# Patient Record
Sex: Female | Born: 1970 | Race: White | Hispanic: No | Marital: Married | Smoking: Never smoker
Health system: Southern US, Community
[De-identification: ages and names within clinical notes are randomized; demographics above are authoritative.]

## PROBLEM LIST (undated history)

## (undated) DIAGNOSIS — F419 Anxiety disorder, unspecified: Secondary | ICD-10-CM

## (undated) DIAGNOSIS — R350 Frequency of micturition: Secondary | ICD-10-CM

## (undated) DIAGNOSIS — N200 Calculus of kidney: Secondary | ICD-10-CM

## (undated) HISTORY — PX: WISDOM TOOTH EXTRACTION: SHX21

## (undated) HISTORY — PX: OTHER SURGICAL HISTORY: SHX169

---

## 2002-09-29 ENCOUNTER — Encounter: Payer: Self-pay | Admitting: Family Medicine

## 2002-09-29 ENCOUNTER — Encounter: Admission: RE | Admit: 2002-09-29 | Discharge: 2002-09-29 | Payer: Self-pay | Admitting: Family Medicine

## 2003-08-13 ENCOUNTER — Other Ambulatory Visit: Admission: RE | Admit: 2003-08-13 | Discharge: 2003-08-13 | Payer: Self-pay | Admitting: Gastroenterology

## 2005-08-18 ENCOUNTER — Other Ambulatory Visit: Admission: RE | Admit: 2005-08-18 | Discharge: 2005-08-18 | Payer: Self-pay | Admitting: Family Medicine

## 2006-02-11 ENCOUNTER — Emergency Department (HOSPITAL_COMMUNITY): Admission: EM | Admit: 2006-02-11 | Discharge: 2006-02-11 | Payer: Self-pay | Admitting: Emergency Medicine

## 2006-10-15 ENCOUNTER — Other Ambulatory Visit: Admission: RE | Admit: 2006-10-15 | Discharge: 2006-10-15 | Payer: Self-pay | Admitting: Family Medicine

## 2010-05-22 ENCOUNTER — Encounter: Payer: Self-pay | Admitting: Family Medicine

## 2011-02-25 ENCOUNTER — Encounter: Payer: Self-pay | Admitting: *Deleted

## 2011-02-25 ENCOUNTER — Emergency Department (HOSPITAL_BASED_OUTPATIENT_CLINIC_OR_DEPARTMENT_OTHER)
Admission: EM | Admit: 2011-02-25 | Discharge: 2011-02-25 | Disposition: A | Discharge status: Home / Self Care | Payer: Managed Care, Other (non HMO) | Attending: Emergency Medicine | Admitting: Emergency Medicine

## 2011-02-25 DIAGNOSIS — N200 Calculus of kidney: Secondary | ICD-10-CM

## 2011-02-25 DIAGNOSIS — R109 Unspecified abdominal pain: Secondary | ICD-10-CM | POA: Insufficient documentation

## 2011-02-25 DIAGNOSIS — R112 Nausea with vomiting, unspecified: Secondary | ICD-10-CM | POA: Insufficient documentation

## 2011-02-25 HISTORY — DX: Calculus of kidney: N20.0

## 2011-02-25 LAB — URINALYSIS, ROUTINE W REFLEX MICROSCOPIC
Bilirubin Urine: NEGATIVE
Glucose, UA: NEGATIVE mg/dL
Ketones, ur: NEGATIVE mg/dL
Nitrite: NEGATIVE
Protein, ur: NEGATIVE mg/dL
Specific Gravity, Urine: 1.022 (ref 1.005–1.030)
Urobilinogen, UA: 0.2 mg/dL (ref 0.0–1.0)
pH: 6 (ref 5.0–8.0)

## 2011-02-25 LAB — URINE MICROSCOPIC-ADD ON

## 2011-02-25 LAB — PREGNANCY, URINE: Preg Test, Ur: NEGATIVE

## 2011-02-25 MED ORDER — HYDROCODONE-ACETAMINOPHEN 5-325 MG PO TABS
1.0000 | ORAL_TABLET | Freq: Once | ORAL | Status: AC
  Administered 2011-02-25: 1 via ORAL
  Filled 2011-02-25: qty 1

## 2011-02-25 MED ORDER — ONDANSETRON HCL 4 MG PO TABS: 4.0000 mg | ORAL_TABLET | Freq: Four times a day (QID) | ORAL | Status: AC

## 2011-02-25 MED ORDER — ONDANSETRON 4 MG PO TBDP
4.0000 mg | ORAL_TABLET | Freq: Once | ORAL | Status: AC
  Administered 2011-02-25: 4 mg via ORAL
  Filled 2011-02-25: qty 1

## 2011-02-25 MED ORDER — HYDROCODONE-ACETAMINOPHEN 5-325 MG PO TABS: ORAL_TABLET | ORAL | Status: DC

## 2011-02-25 NOTE — ED Provider Notes (Signed)
History     CSN: [id number] Arrival date & time: 02/25/2011  5:15 PM   First MD Initiated Contact with Patient 02/25/11 1914      Chief Complaint  Patient presents with  . Flank Pain    (Consider location/radiation/quality/duration/timing/severity/associated sxs/prior treatment) HPI Comments: Pt began having R flank pain ~ 1000 today.  accompanied by diaphoresis and nausea and vomiting and hematuria.  Felt like the last kidney stone she had 4 years ago./  No UTI sxs.  No fever.  No trauma.   Patient is a 40 y.o. female presenting with flank pain. The history is provided by the patient. No language interpreter was used.  Flank Pain This is a new problem. The current episode started today. The problem occurs constantly. The problem has been resolved. Associated symptoms include nausea and vomiting. Pertinent negatives include no chills or fever. The symptoms are aggravated by nothing.    Past Medical History  Diagnosis Date  . Kidney stones     Past Surgical History  Procedure Date  . Other surgical history cyst removed from urethra    No family history on file.  History  Substance Use Topics  . Smoking status: Never Smoker   . Smokeless tobacco: Not on file  . Alcohol Use: No    OB History    Grav Para Term Preterm Abortions TAB SAB Ect Mult Living                  Review of Systems  Constitutional: Negative for fever and chills.  Gastrointestinal: Positive for nausea and vomiting.  Genitourinary: Positive for hematuria and flank pain. Negative for dysuria, urgency, vaginal bleeding, vaginal discharge and vaginal pain.  All other systems reviewed and are negative.    Allergies  Review of patient's allergies indicates no known allergies.  Home Medications   Current Outpatient Rx  Name Route Sig Dispense Refill  . CIPROFLOXACIN HCL 250 MG PO TABS Oral Take 250 mg by mouth 2 (two) times daily.      Marland Kitchen LEVONORGEST-ETH ESTRAD 91-DAY 0.15-0.03 MG PO TABS Oral  Take 1 tablet by mouth daily.      Marland Kitchen LORATADINE 10 MG PO TABS Oral Take 10 mg by mouth daily.      Marland Kitchen ONE-DAILY MULTI VITAMINS PO TABS Oral Take 1 tablet by mouth daily.        BP 120/79  Pulse 96  Temp(Src) 98.2 F (36.8 C) (Oral)  Resp 20  Ht 5\' 5"  (1.651 m)  Wt 180 lb (81.647 kg)  BMI 29.95 kg/m2  SpO2 98%  Physical Exam  Nursing note and vitals reviewed. Constitutional: She is oriented to person, place, and time. She appears well-developed and well-nourished. No distress.  HENT:  Head: Normocephalic and atraumatic.  Eyes: EOM are normal.  Neck: Normal range of motion.  Cardiovascular: Normal rate, regular rhythm and normal heart sounds.   Pulmonary/Chest: Effort normal and breath sounds normal.  Abdominal: Soft. She exhibits no distension. There is no tenderness.    Musculoskeletal: Normal range of motion.       Back:  Neurological: She is alert and oriented to person, place, and time.  Skin: Skin is warm and dry.  Psychiatric: She has a normal mood and affect. Judgment normal.    ED Course  Procedures (including critical care time)  Labs Reviewed  URINALYSIS, ROUTINE W REFLEX MICROSCOPIC - Abnormal; Notable for the following:    Appearance CLOUDY (*)    Hgb urine dipstick LARGE (*)  Leukocytes, UA TRACE (*)    All other components within normal limits  URINE MICROSCOPIC-ADD ON - Abnormal; Notable for the following:    Bacteria, UA MANY (*)    Casts HYALINE CASTS (*)    All other components within normal limits  PREGNANCY, URINE   No results found.   No diagnosis found.    MDM  Spoke with pt about the lack of need for a CT scan of the abd/pelvis at this point since her pain has greatly dissipated.  She had hematuria and similar sxs with a previous kidney stone.  She understands and agrees.        Worthy Rancher, PA 02/25/11 1945

## 2011-02-25 NOTE — ED Notes (Signed)
Pt states has had flank pain since WednesdayMicah Benitez to PCP and was started on antibiotic for possible UTI- office called on Friday and said it wasn't a UTI- here for further eval- states difficulty urinating and loose stools

## 2011-02-25 NOTE — ED Provider Notes (Signed)
Medical screening examination/treatment/procedure(s) were performed by non-physician practitioner and as supervising physician I was immediately available for consultation/collaboration.  Raeford Razor, MD 02/25/11 2351

## 2011-02-25 NOTE — ED Notes (Signed)
Pt states she is feeling much better now after voiding and is comfortable. Offered to start IV, but pt requests that we wait since she is feeling better. (Has had several bad experiences prior with IV's) Also offered to give pt Zofran ODT, which she also declined because "it gave my mom a severe migraine".

## 2011-02-28 ENCOUNTER — Emergency Department (HOSPITAL_COMMUNITY): Payer: Managed Care, Other (non HMO)

## 2011-02-28 ENCOUNTER — Emergency Department (HOSPITAL_COMMUNITY)
Admission: EM | Admit: 2011-02-28 | Discharge: 2011-02-28 | Disposition: A | Discharge status: Home / Self Care | Payer: Managed Care, Other (non HMO) | Attending: Emergency Medicine | Admitting: Emergency Medicine

## 2011-02-28 DIAGNOSIS — R319 Hematuria, unspecified: Secondary | ICD-10-CM | POA: Insufficient documentation

## 2011-02-28 DIAGNOSIS — R109 Unspecified abdominal pain: Secondary | ICD-10-CM | POA: Insufficient documentation

## 2011-02-28 DIAGNOSIS — Z87442 Personal history of urinary calculi: Secondary | ICD-10-CM | POA: Insufficient documentation

## 2011-02-28 DIAGNOSIS — R112 Nausea with vomiting, unspecified: Secondary | ICD-10-CM | POA: Insufficient documentation

## 2011-02-28 LAB — POCT PREGNANCY, URINE: Preg Test, Ur: NEGATIVE

## 2011-02-28 LAB — URINALYSIS, ROUTINE W REFLEX MICROSCOPIC
Bilirubin Urine: NEGATIVE
Leukocytes, UA: NEGATIVE
Nitrite: NEGATIVE
Specific Gravity, Urine: 1.028 (ref 1.005–1.030)
pH: 7 (ref 5.0–8.0)

## 2011-02-28 LAB — URINE MICROSCOPIC-ADD ON

## 2013-05-05 IMAGING — CT CT ABD-PELV W/O CM
1 of 2 series · 16 of 32 positions shown, 20 images · non-contrast
Comparison: 02/11/2006

CLINICAL DATA: Pelvic pain, pressure.

CT ABDOMEN AND PELVIS WITHOUT CONTRAST
TECHNIQUE: Multidetector CT imaging of the abdomen and pelvis was
performed following the standard protocol without intravenous
contrast.

[Series 2: abd/pel w/o · axial · non-contrast · 0.79mm/px · z∈[+894,+1319]mm · 16 of 93 slices shown, 20 images]
[im 4/93  soft-tissue]
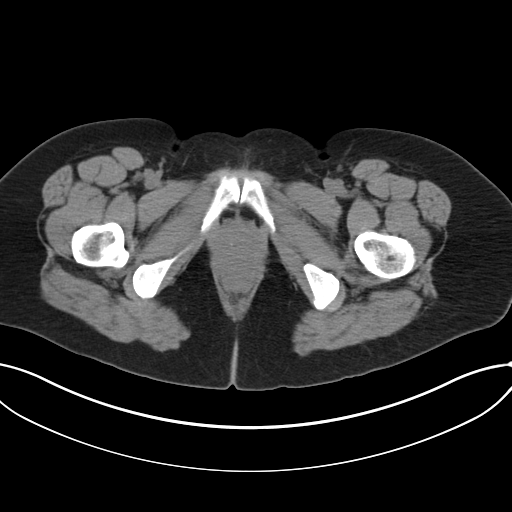
[im 4/93  bone]
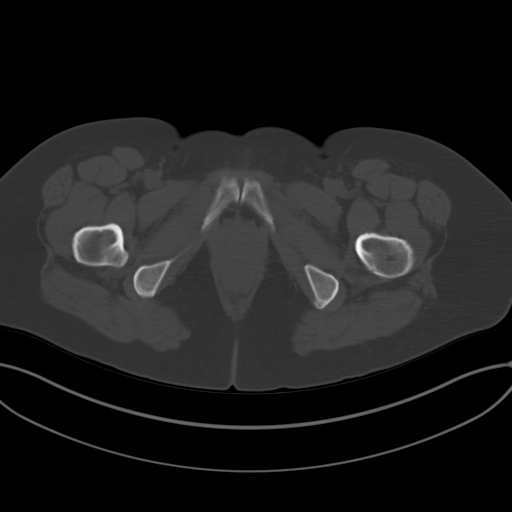
[im 12/93  soft-tissue]
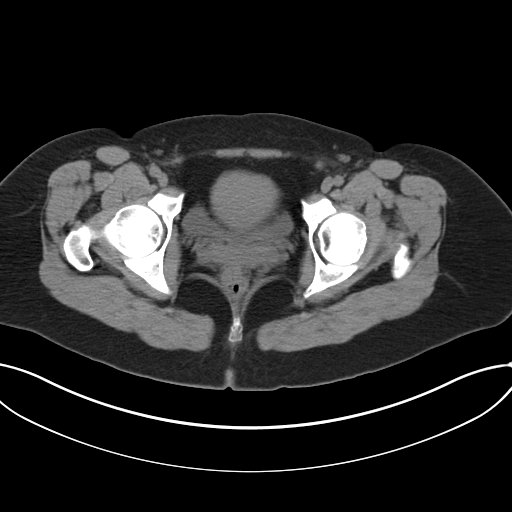
[im 20/93  soft-tissue]
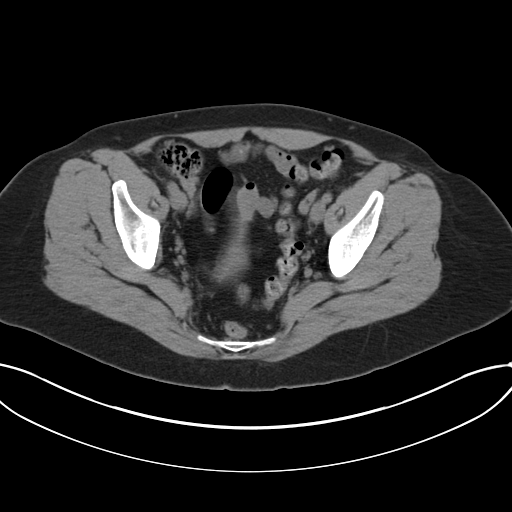
[im 24/93  soft-tissue]
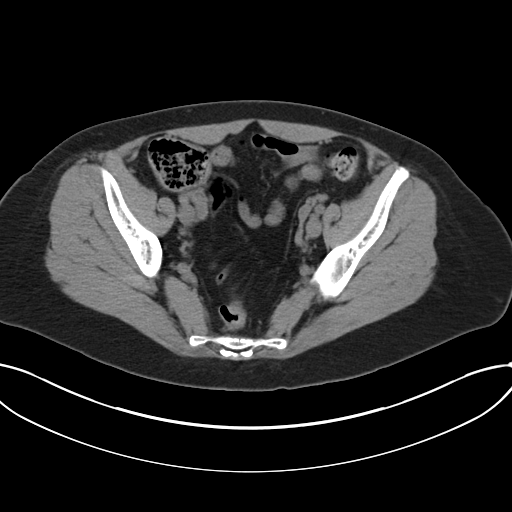
[im 31/93  soft-tissue]
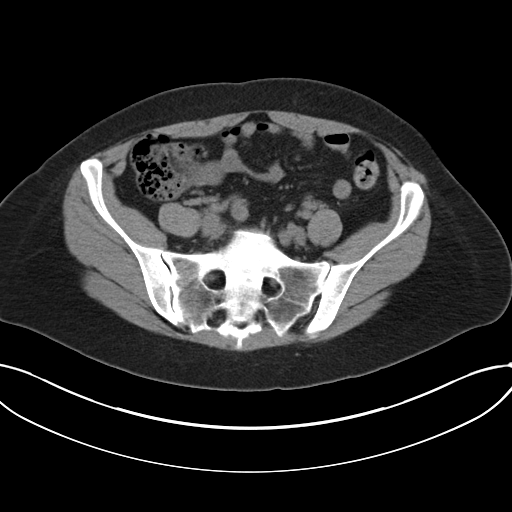
[im 39/93  soft-tissue]
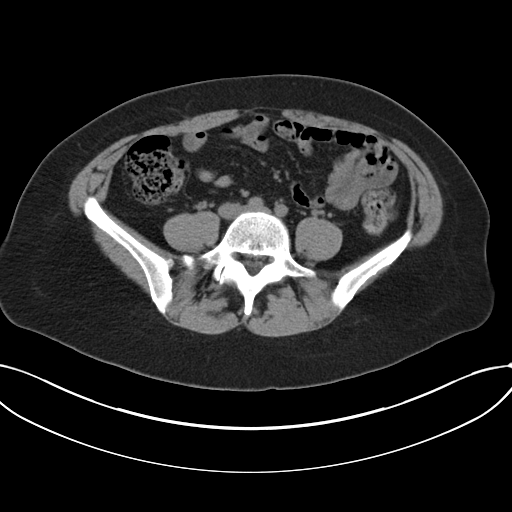
[im 43/93  soft-tissue]
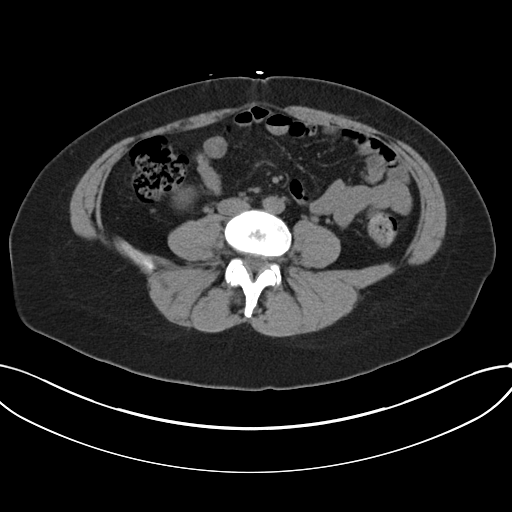
[im 50/93  soft-tissue]
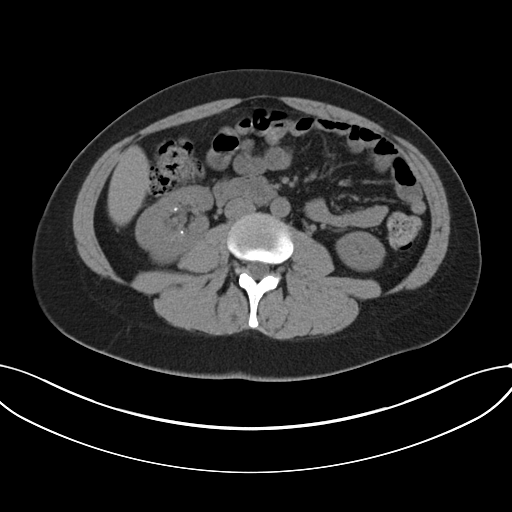
[im 54/93  soft-tissue]
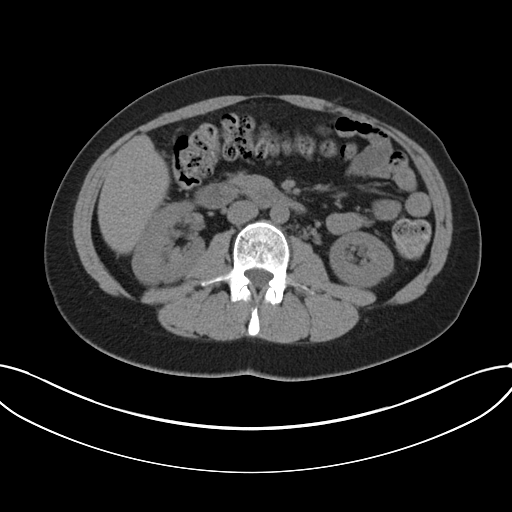
[im 54/93  bone]
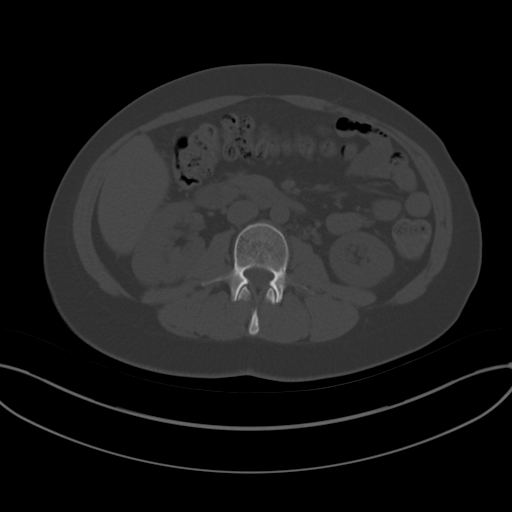
[im 62/93  soft-tissue]
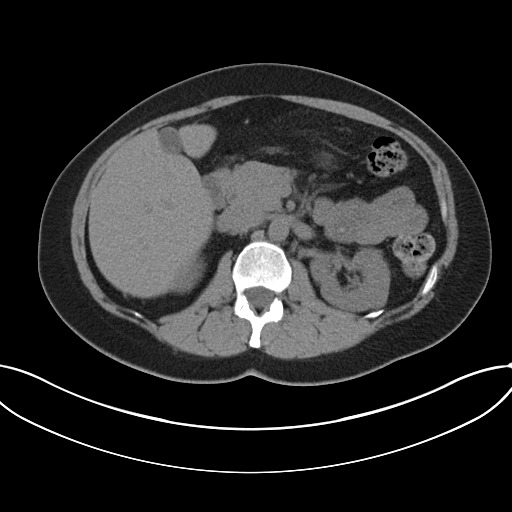
[im 70/93  soft-tissue]
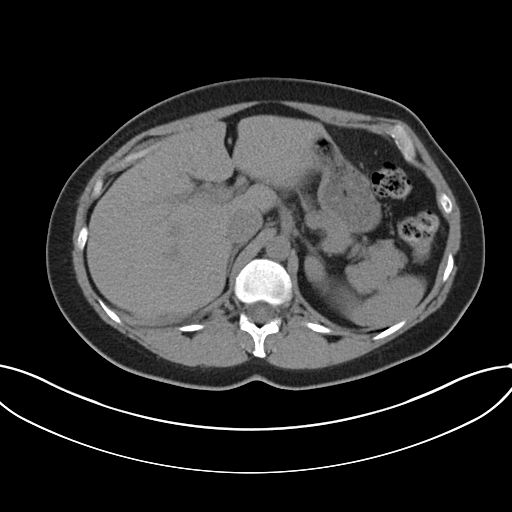
[im 73/93  soft-tissue]
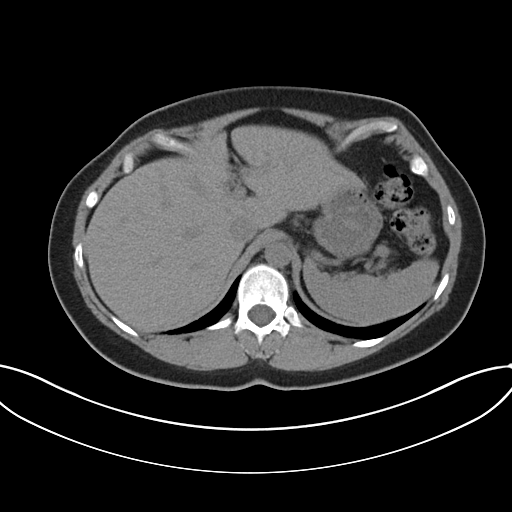
[im 77/93  lung]
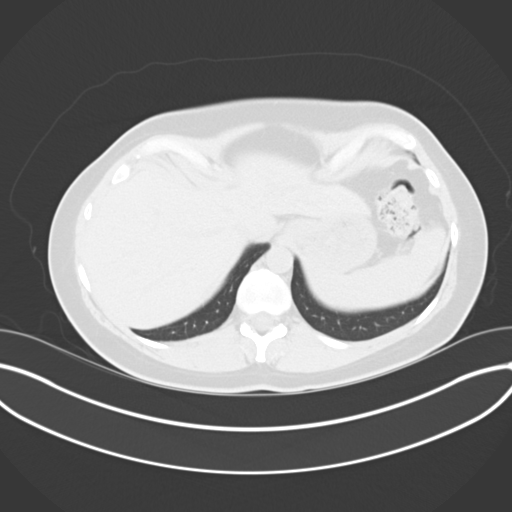
[im 81/93  soft-tissue]
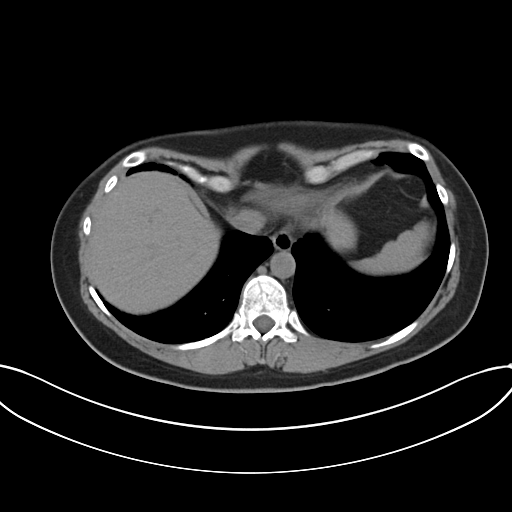
[im 81/93  lung]
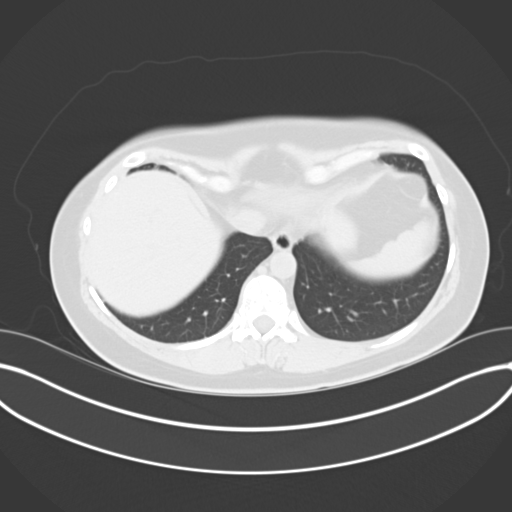
[im 85/93  lung]
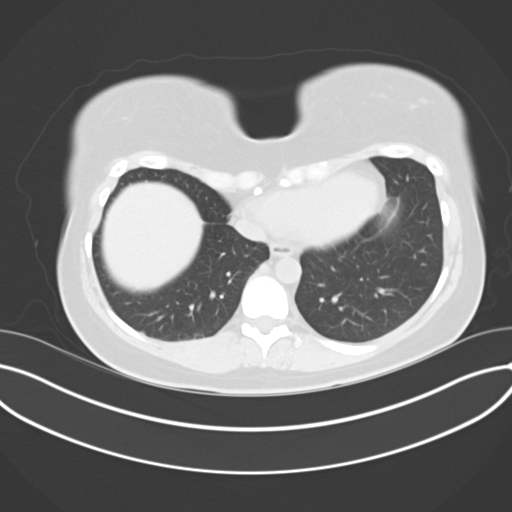
[im 89/93  soft-tissue]
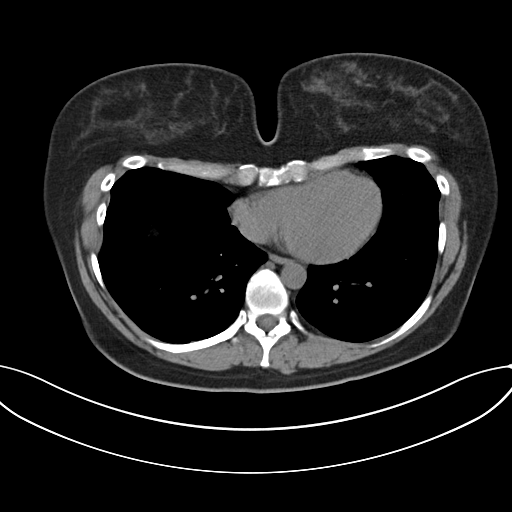
[im 89/93  lung]
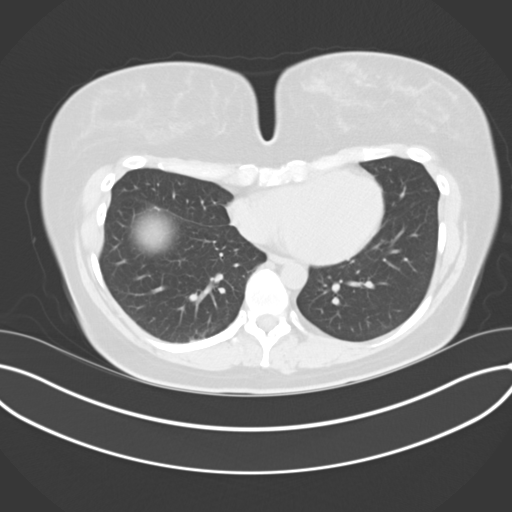

[16 of 32 positions shown; findings below may reference images not displayed]

FINDINGS: Lung bases are clear.  No effusions.  Heart is normal
size.

Small punctate nonobstructing stones in the kidneys bilaterally.
No hydronephrosis.  Calcifications are seen in the anatomic pelvis
bilaterally.  They are difficult to separate from the distal
ureters, but given the lack of hydronephrosis, I suspect these are
phleboliths.  Urinary bladder decompressed.

Uterus and adnexa have an unremarkable unenhanced appearance.
Large and small bowel are unremarkable.  No free fluid, free air or
adenopathy.
IMPRESSION: Bilateral nephrolithiasis.  No ureteral stones or hydronephrosis.

## 2013-08-08 ENCOUNTER — Encounter (HOSPITAL_BASED_OUTPATIENT_CLINIC_OR_DEPARTMENT_OTHER): Payer: Self-pay | Admitting: *Deleted

## 2013-08-08 NOTE — Progress Notes (Signed)
No labs needed

## 2013-08-12 ENCOUNTER — Ambulatory Visit (HOSPITAL_BASED_OUTPATIENT_CLINIC_OR_DEPARTMENT_OTHER): Payer: BC Managed Care – PPO | Admitting: Anesthesiology

## 2013-08-12 ENCOUNTER — Encounter (HOSPITAL_BASED_OUTPATIENT_CLINIC_OR_DEPARTMENT_OTHER)
Admission: RE | Disposition: A | Discharge status: Home / Self Care | Payer: Self-pay | Source: Ambulatory Visit | Attending: Plastic Surgery

## 2013-08-12 ENCOUNTER — Encounter (HOSPITAL_BASED_OUTPATIENT_CLINIC_OR_DEPARTMENT_OTHER): Payer: BC Managed Care – PPO | Admitting: Anesthesiology

## 2013-08-12 ENCOUNTER — Encounter (HOSPITAL_BASED_OUTPATIENT_CLINIC_OR_DEPARTMENT_OTHER): Payer: Self-pay | Admitting: Anesthesiology

## 2013-08-12 ENCOUNTER — Ambulatory Visit (HOSPITAL_BASED_OUTPATIENT_CLINIC_OR_DEPARTMENT_OTHER)
Admission: RE | Admit: 2013-08-12 | Discharge: 2013-08-12 | Disposition: A | Discharge status: Home / Self Care | Payer: BC Managed Care – PPO | Source: Ambulatory Visit | Attending: Plastic Surgery | Admitting: Plastic Surgery

## 2013-08-12 DIAGNOSIS — N6019 Diffuse cystic mastopathy of unspecified breast: Secondary | ICD-10-CM | POA: Insufficient documentation

## 2013-08-12 DIAGNOSIS — N62 Hypertrophy of breast: Secondary | ICD-10-CM | POA: Insufficient documentation

## 2013-08-12 DIAGNOSIS — M546 Pain in thoracic spine: Secondary | ICD-10-CM | POA: Insufficient documentation

## 2013-08-12 DIAGNOSIS — M542 Cervicalgia: Secondary | ICD-10-CM | POA: Insufficient documentation

## 2013-08-12 DIAGNOSIS — R21 Rash and other nonspecific skin eruption: Secondary | ICD-10-CM | POA: Insufficient documentation

## 2013-08-12 HISTORY — DX: Frequency of micturition: R35.0

## 2013-08-12 HISTORY — DX: Anxiety disorder, unspecified: F41.9

## 2013-08-12 HISTORY — PX: BREAST REDUCTION SURGERY: SHX8

## 2013-08-12 LAB — POCT HEMOGLOBIN-HEMACUE: HEMOGLOBIN: 14 g/dL (ref 12.0–15.0)

## 2013-08-12 SURGERY — BREAST REDUCTION WITH LIPOSUCTION
Anesthesia: General | Site: Breast | Laterality: Bilateral

## 2013-08-12 MED ORDER — CEFAZOLIN SODIUM-DEXTROSE 2-3 GM-% IV SOLR
INTRAVENOUS | Status: AC
  Filled 2013-08-12: qty 50

## 2013-08-12 MED ORDER — MIDAZOLAM HCL 5 MG/5ML IJ SOLN
INTRAMUSCULAR | Status: DC | PRN
  Administered 2013-08-12: 2 mg via INTRAVENOUS

## 2013-08-12 MED ORDER — LIDOCAINE-EPINEPHRINE 1 %-1:100000 IJ SOLN
INTRAMUSCULAR | Status: AC
  Filled 2013-08-12: qty 1

## 2013-08-12 MED ORDER — BUPIVACAINE LIPOSOME 1.3 % IJ SUSP
INTRAMUSCULAR | Status: AC
  Filled 2013-08-12: qty 20

## 2013-08-12 MED ORDER — OXYCODONE HCL 5 MG PO TABS: 5.0000 mg | ORAL_TABLET | Freq: Once | ORAL | Status: DC | PRN

## 2013-08-12 MED ORDER — MIDAZOLAM HCL 2 MG/2ML IJ SOLN
INTRAMUSCULAR | Status: AC
  Filled 2013-08-12: qty 2

## 2013-08-12 MED ORDER — FENTANYL CITRATE 0.05 MG/ML IJ SOLN
INTRAMUSCULAR | Status: AC
  Filled 2013-08-12: qty 10

## 2013-08-12 MED ORDER — LIDOCAINE-EPINEPHRINE 1 %-1:100000 IJ SOLN
INTRAMUSCULAR | Status: DC | PRN
  Administered 2013-08-12: 40 mL

## 2013-08-12 MED ORDER — SUCCINYLCHOLINE CHLORIDE 20 MG/ML IJ SOLN
INTRAMUSCULAR | Status: DC | PRN
  Administered 2013-08-12: 100 mg via INTRAVENOUS

## 2013-08-12 MED ORDER — CEFAZOLIN SODIUM 1-5 GM-% IV SOLN
1.0000 g | Freq: Once | INTRAVENOUS | Status: AC
  Administered 2013-08-12: 2 g via INTRAVENOUS

## 2013-08-12 MED ORDER — OXYCODONE HCL 5 MG/5ML PO SOLN: 5.0000 mg | Freq: Once | ORAL | Status: DC | PRN

## 2013-08-12 MED ORDER — PROPOFOL 10 MG/ML IV BOLUS
INTRAVENOUS | Status: DC | PRN
  Administered 2013-08-12: 150 mg via INTRAVENOUS
  Administered 2013-08-12: 50 mg via INTRAVENOUS

## 2013-08-12 MED ORDER — BACITRACIN ZINC 500 UNIT/GM EX OINT
TOPICAL_OINTMENT | CUTANEOUS | Status: DC | PRN
  Administered 2013-08-12: 1 via TOPICAL

## 2013-08-12 MED ORDER — PROMETHAZINE HCL 25 MG/ML IJ SOLN
INTRAMUSCULAR | Status: AC
  Filled 2013-08-12: qty 1

## 2013-08-12 MED ORDER — FENTANYL CITRATE 0.05 MG/ML IJ SOLN: 50.0000 ug | INTRAMUSCULAR | Status: DC | PRN

## 2013-08-12 MED ORDER — ONDANSETRON 8 MG PO TBDP
ORAL_TABLET | ORAL | Status: AC
  Filled 2013-08-12: qty 1

## 2013-08-12 MED ORDER — 0.9 % SODIUM CHLORIDE (POUR BTL) OPTIME
TOPICAL | Status: DC | PRN
  Administered 2013-08-12: 2000 mL

## 2013-08-12 MED ORDER — LIDOCAINE HCL (PF) 1 % IJ SOLN
INTRAMUSCULAR | Status: AC
  Filled 2013-08-12: qty 30

## 2013-08-12 MED ORDER — EPINEPHRINE HCL 1 MG/ML IJ SOLN
INTRAMUSCULAR | Status: AC
  Filled 2013-08-12: qty 1

## 2013-08-12 MED ORDER — ONDANSETRON 8 MG PO TBDP
8.0000 mg | ORAL_TABLET | Freq: Once | ORAL | Status: AC | PRN
  Administered 2013-08-12: 8 mg via ORAL

## 2013-08-12 MED ORDER — FENTANYL CITRATE 0.05 MG/ML IJ SOLN
INTRAMUSCULAR | Status: DC | PRN
  Administered 2013-08-12: 50 ug via INTRAVENOUS
  Administered 2013-08-12 (×2): 100 ug via INTRAVENOUS
  Administered 2013-08-12: 50 ug via INTRAVENOUS
  Administered 2013-08-12: 100 ug via INTRAVENOUS
  Administered 2013-08-12 (×3): 50 ug via INTRAVENOUS
  Administered 2013-08-12: 100 ug via INTRAVENOUS
  Administered 2013-08-12 (×6): 50 ug via INTRAVENOUS

## 2013-08-12 MED ORDER — LIDOCAINE HCL (CARDIAC) 10 MG/ML IV SOLN
INTRAVENOUS | Status: DC | PRN
  Administered 2013-08-12: 40 mg via INTRAVENOUS

## 2013-08-12 MED ORDER — BUPIVACAINE LIPOSOME 1.3 % IJ SUSP
INTRAMUSCULAR | Status: DC | PRN
  Administered 2013-08-12: 20 mL

## 2013-08-12 MED ORDER — LACTATED RINGERS IV SOLN
INTRAVENOUS | Status: DC
  Administered 2013-08-12 (×4): via INTRAVENOUS

## 2013-08-12 MED ORDER — DEXAMETHASONE SODIUM PHOSPHATE 4 MG/ML IJ SOLN
INTRAMUSCULAR | Status: DC | PRN
  Administered 2013-08-12: 10 mg via INTRAVENOUS

## 2013-08-12 MED ORDER — ONDANSETRON HCL 4 MG/2ML IJ SOLN
INTRAMUSCULAR | Status: DC | PRN
  Administered 2013-08-12: 4 mg via INTRAVENOUS

## 2013-08-12 MED ORDER — BACITRACIN ZINC 500 UNIT/GM EX OINT
TOPICAL_OINTMENT | CUTANEOUS | Status: AC
  Filled 2013-08-12: qty 28.35

## 2013-08-12 MED ORDER — BUPIVACAINE-EPINEPHRINE PF 0.5-1:200000 % IJ SOLN
INTRAMUSCULAR | Status: AC
  Filled 2013-08-12: qty 30

## 2013-08-12 MED ORDER — HYDROMORPHONE HCL PF 1 MG/ML IJ SOLN: 0.2500 mg | INTRAMUSCULAR | Status: DC | PRN

## 2013-08-12 MED ORDER — MIDAZOLAM HCL 2 MG/2ML IJ SOLN: 1.0000 mg | INTRAMUSCULAR | Status: DC | PRN

## 2013-08-12 MED ORDER — FENTANYL CITRATE 0.05 MG/ML IJ SOLN
INTRAMUSCULAR | Status: AC
  Filled 2013-08-12: qty 20

## 2013-08-12 MED ORDER — PROPOFOL 10 MG/ML IV BOLUS
INTRAVENOUS | Status: AC
  Filled 2013-08-12: qty 20

## 2013-08-12 MED ORDER — PROMETHAZINE HCL 25 MG/ML IJ SOLN
6.2500 mg | INTRAMUSCULAR | Status: DC | PRN
  Administered 2013-08-12: 6.25 mg via INTRAVENOUS

## 2013-08-12 SURGICAL SUPPLY — 73 items
APL SKNCLS STERI-STRIP NONHPOA (GAUZE/BANDAGES/DRESSINGS) ×2
BAG DECANTER FOR FLEXI CONT (MISCELLANEOUS) ×2 IMPLANT
BENZOIN TINCTURE PRP APPL 2/3 (GAUZE/BANDAGES/DRESSINGS) ×4 IMPLANT
BLADE KNIFE PERSONA 10 (BLADE) ×8 IMPLANT
BLADE KNIFE PERSONA 15 (BLADE) ×6 IMPLANT
BNDG GAUZE ELAST 4 BULKY (GAUZE/BANDAGES/DRESSINGS) ×4 IMPLANT
CANISTER SUCT 1200ML W/VALVE (MISCELLANEOUS) ×3 IMPLANT
CAP BOUFFANT 24 BLUE NURSES (PROTECTIVE WEAR) ×2 IMPLANT
COVER MAYO STAND STRL (DRAPES) ×2 IMPLANT
COVER TABLE BACK 60X90 (DRAPES) ×2 IMPLANT
DECANTER SPIKE VIAL GLASS SM (MISCELLANEOUS) ×5 IMPLANT
DRAIN CHANNEL 10F 3/8 F FF (DRAIN) ×4 IMPLANT
DRAPE LAPAROSCOPIC ABDOMINAL (DRAPES) ×1 IMPLANT
DRAPE U-SHAPE 76X120 STRL (DRAPES) ×2 IMPLANT
DRSG EMULSION OIL 3X3 NADH (GAUZE/BANDAGES/DRESSINGS) ×4 IMPLANT
DRSG PAD ABDOMINAL 8X10 ST (GAUZE/BANDAGES/DRESSINGS) ×4 IMPLANT
ELECT REM PT RETURN 9FT ADLT (ELECTROSURGICAL) ×2
ELECTRODE REM PT RTRN 9FT ADLT (ELECTROSURGICAL) ×1 IMPLANT
EVACUATOR SILICONE 100CC (DRAIN) ×4 IMPLANT
FILTER 7/8 IN (FILTER) ×2 IMPLANT
FILTER LIPOSUCTION (MISCELLANEOUS) ×1 IMPLANT
GLOVE BIO SURGEON STRL SZ 6.5 (GLOVE) ×2 IMPLANT
GLOVE BIO SURGEON STRL SZ7 (GLOVE) ×2 IMPLANT
GLOVE BIOGEL M 7.0 STRL (GLOVE) ×4 IMPLANT
GLOVE BIOGEL PI IND STRL 6.5 (GLOVE) IMPLANT
GLOVE BIOGEL PI IND STRL 7.0 (GLOVE) IMPLANT
GLOVE BIOGEL PI INDICATOR 6.5 (GLOVE)
GLOVE BIOGEL PI INDICATOR 7.0 (GLOVE) ×1
GLOVE ECLIPSE 6.5 STRL STRAW (GLOVE) ×2 IMPLANT
GOWN STRL REUS W/ TWL LRG LVL3 (GOWN DISPOSABLE) ×1 IMPLANT
GOWN STRL REUS W/ TWL XL LVL3 (GOWN DISPOSABLE) IMPLANT
GOWN STRL REUS W/TWL LRG LVL3 (GOWN DISPOSABLE) ×6
GOWN STRL REUS W/TWL XL LVL3 (GOWN DISPOSABLE)
IV SODIUM CHL 0.9% 500ML (IV SOLUTION) ×1 IMPLANT
LINER CANISTER 1000CC FLEX (MISCELLANEOUS) ×4 IMPLANT
MARKER SKIN DUAL TIP RULER LAB (MISCELLANEOUS) ×2 IMPLANT
NDL HYPO 25X1 1.5 SAFETY (NEEDLE) ×1 IMPLANT
NDL SAFETY ECLIPSE 18X1.5 (NEEDLE) IMPLANT
NDL SPNL 18GX3.5 QUINCKE PK (NEEDLE) ×1 IMPLANT
NEEDLE HYPO 18GX1.5 SHARP (NEEDLE)
NEEDLE HYPO 25X1 1.5 SAFETY (NEEDLE) ×4 IMPLANT
NEEDLE SPNL 18GX3.5 QUINCKE PK (NEEDLE) ×2 IMPLANT
NS IRRIG 1000ML POUR BTL (IV SOLUTION) ×4 IMPLANT
PACK BASIN DAY SURGERY FS (CUSTOM PROCEDURE TRAY) ×2 IMPLANT
PIN SAFETY STERILE (MISCELLANEOUS) ×2 IMPLANT
SCRUB PCMX 4 OZ (MISCELLANEOUS) ×3 IMPLANT
SHEET MEDIUM DRAPE 40X70 STRL (DRAPES) ×4 IMPLANT
SLEEVE SCD COMPRESS KNEE MED (MISCELLANEOUS) ×2 IMPLANT
SPECIMEN JAR MEDIUM (MISCELLANEOUS) ×3 IMPLANT
SPECIMEN JAR X LARGE (MISCELLANEOUS) ×3 IMPLANT
SPONGE GAUZE 4X4 12PLY (GAUZE/BANDAGES/DRESSINGS) ×4 IMPLANT
SPONGE LAP 18X18 X RAY DECT (DISPOSABLE) ×8 IMPLANT
STAPLER VISISTAT 35W (STAPLE) ×4 IMPLANT
STRIP CLOSURE SKIN 1/2X4 (GAUZE/BANDAGES/DRESSINGS) ×8 IMPLANT
SUT ETHILON 3 0 PS 1 (SUTURE) ×2 IMPLANT
SUT MNCRL AB 3-0 PS2 18 (SUTURE) ×8 IMPLANT
SUT MNCRL AB 4-0 PS2 18 (SUTURE) ×4 IMPLANT
SUT MON AB 5-0 PS2 18 (SUTURE) ×4 IMPLANT
SUT PROLENE 2 0 CT2 30 (SUTURE) ×2 IMPLANT
SUT PROLENE 3 0 PS 1 (SUTURE) ×4 IMPLANT
SUT QUILL PDO 2-0 (SUTURE) ×4 IMPLANT
SYR 50ML LL SCALE MARK (SYRINGE) IMPLANT
SYR BULB IRRIGATION 50ML (SYRINGE) ×4 IMPLANT
SYR CONTROL 10ML LL (SYRINGE) ×6 IMPLANT
SYR TB 1ML LL NO SAFETY (SYRINGE) IMPLANT
TOWEL OR NON WOVEN STRL DISP B (DISPOSABLE) ×2 IMPLANT
TRAY DSU PREP LF (CUSTOM PROCEDURE TRAY) ×3 IMPLANT
TRAY FOLEY CATH 14FR (SET/KITS/TRAYS/PACK) ×2 IMPLANT
TUBE CONNECTING 20X1/4 (TUBING) ×2 IMPLANT
TUBING SET GRADUATE ASPIR 12FT (MISCELLANEOUS) ×2 IMPLANT
UNDERPAD 30X30 INCONTINENT (UNDERPADS AND DIAPERS) ×4 IMPLANT
VAC PENCILS W/TUBING CLEAR (MISCELLANEOUS) ×2 IMPLANT
YANKAUER SUCT BULB TIP NO VENT (SUCTIONS) ×2 IMPLANT

## 2013-08-12 NOTE — Transfer of Care (Signed)
Immediate Anesthesia Transfer of Care Note  Patient: Anne SackHeather Benitez  Procedure(s) Performed: Procedure(s): BREAST REDUCTION BILATERAL (Bilateral)  Patient Location: PACU  Anesthesia Type:General  Level of Consciousness: sedated and responds to stimulation  Airway & Oxygen Therapy: Patient Spontanous Breathing and Patient connected to face mask oxygen  Post-op Assessment: Report given to PACU RN and Post -op Vital signs reviewed and stable  Post vital signs: Reviewed and stable  Complications: No apparent anesthesia complications

## 2013-08-12 NOTE — Anesthesia Postprocedure Evaluation (Signed)
  Anesthesia Post-op Note  Patient: Anne SackHeather Benitez  Procedure(s) Performed: Procedure(s): BREAST REDUCTION BILATERAL (Bilateral)  Patient Location: PACU  Anesthesia Type:General  Level of Consciousness: awake and alert   Airway and Oxygen Therapy: Patient Spontanous Breathing  Post-op Pain: none  Post-op Assessment: Post-op Vital signs reviewed, Patient's Cardiovascular Status Stable and Respiratory Function Stable  Post-op Vital Signs: Reviewed  Filed Vitals:   08/12/13 1315  BP: 118/61  Pulse: 101  Temp:   Resp: 1    Complications: No apparent anesthesia complications

## 2013-08-12 NOTE — Brief Op Note (Signed)
08/12/2013  11:53 AM  PATIENT:  Anne Benitez  [age] y.o. female  PRE-OPERATIVE DIAGNOSIS:  Hypertrophy of breast; bilateral  POST-OPERATIVE DIAGNOSIS:  Bilateral hypertrophy of breast  PROCEDURE:  Procedure(s): BREAST REDUCTION BILATERAL (Bilateral)  SURGEON:  Surgeon(s) and Role:    * Mary A Contogiannis, MD - Primary  ANESTHESIA:   general  EBL:  Total I/O In: 3000 [I.V.:3000] Out: 1000 [Urine:750; Blood:250]  BLOOD ADMINISTERED:none  DRAINS: (25F) Jackson-Pratt drain(s) with closed bulb suction in the Bilateral Breasts   LOCAL MEDICATIONS USED:  OTHER 1.3% Exparel (266 mgs.)  SPECIMEN:  Source of Specimen:  Bilateral Breasts  DISPOSITION OF SPECIMEN:  PATHOLOGY  COUNTS:  YES  DICTATION: .Other Dictation: Dictation Number [id number]  PLAN OF CARE: Discharge to home after PACU  PATIENT DISPOSITION:  PACU - hemodynamically stable.   Delay start of Pharmacological VTE agent (>24hrs) due to surgical blood loss or risk of bleeding: not applicable

## 2013-08-12 NOTE — H&P (Signed)
  H&P faxed to surgical center.  -History and Physical Reviewed  -Patient has been re-examined  -No change in the plan of care  CONTOGIANNIS,MARY A    

## 2013-08-12 NOTE — Anesthesia Procedure Notes (Signed)
Procedure Name: Intubation Date/Time: 08/12/2013 7:47 AM Performed by: Gar GibbonKEETON, Zaryiah Barz S Pre-anesthesia Checklist: Patient identified, Emergency Drugs available, Suction available and Patient being monitored Patient Re-evaluated:Patient Re-evaluated prior to inductionOxygen Delivery Method: Circle System Utilized Preoxygenation: Pre-oxygenation with 100% oxygen Intubation Type: IV induction Ventilation: Mask ventilation without difficulty Laryngoscope Size: Mac and 3 Grade View: Grade II Tube type: Oral Tube size: 7.0 mm Number of attempts: 1 Airway Equipment and Method: stylet and oral airway Placement Confirmation: ETT inserted through vocal cords under direct vision,  positive ETCO2 and breath sounds checked- equal and bilateral Secured at: 22 cm Tube secured with: Tape Dental Injury: Teeth and Oropharynx as per pre-operative assessment

## 2013-08-12 NOTE — Anesthesia Preprocedure Evaluation (Signed)
Anesthesia Evaluation  Patient identified by MRN, date of birth, ID band Patient awake    Reviewed: Allergy & Precautions, H&P , NPO status , Patient's Chart, lab work & pertinent test results  Airway Mallampati: II  TM Distance: >3 FB Neck ROM: Full    Dental no notable dental hx. (+) Teeth Intact, Dental Advisory Given   Pulmonary neg pulmonary ROS,  breath sounds clear to auscultation  Pulmonary exam normal       Cardiovascular negative cardio ROS  Rhythm:Regular Rate:Normal     Neuro/Psych negative neurological ROS  negative psych ROS   GI/Hepatic negative GI ROS, Neg liver ROS,   Endo/Other  negative endocrine ROS  Renal/GU negative Renal ROS  negative genitourinary   Musculoskeletal   Abdominal   Peds  Hematology negative hematology ROS (+)   Anesthesia Other Findings   Reproductive/Obstetrics negative OB ROS                            Anesthesia Physical Anesthesia Plan  ASA: II  Anesthesia Plan: General   Post-op Pain Management:    Induction: Intravenous  Airway Management Planned: Oral ETT  Additional Equipment:   Intra-op Plan:   Post-operative Plan: Extubation in OR  Informed Consent: I have reviewed the patients History and Physical, chart, labs and discussed the procedure including the risks, benefits and alternatives for the proposed anesthesia with the patient or authorized representative who has indicated his/her understanding and acceptance.   Dental advisory given  Plan Discussed with: CRNA  Anesthesia Plan Comments:         Anesthesia Quick Evaluation  

## 2013-08-12 NOTE — Discharge Instructions (Signed)
1. No lifting greater than 5 lbs with arms for 4 weeks. 2. Empty, strip, record and reactivate JP drains 3 times a day. 3. Percocet 5/325 mg tabs 1-2 tabs po q 4-6 hours prn pain- prescription given in office. 4. Duricef 1 tab po bid- prescription given in office. 5. Sterapred dose pack as directed- prescription given in office. 6. Follow-up appointment Friday in office.    Post Anesthesia Home Care Instructions  Activity: Get plenty of rest for the remainder of the day. A responsible adult should stay with you for 24 hours following the procedure.  For the next 24 hours, DO NOT: -Drive a car -Operate machinery -Drink alcoholic beverages -Take any medication unless instructed by your physician -Make any legal decisions or sign important papers.  Meals: Start with liquid foods such as gelatin or soup. Progress to regular foods as tolerated. Avoid greasy, spicy, heavy foods. If nausea and/or vomiting occur, drink only clear liquids until the nausea and/or vomiting subsides. Call your physician if vomiting continues.  Special Instructions/Symptoms: Your throat may feel dry or sore from the anesthesia or the breathing tube placed in your throat during surgery. If this causes discomfort, gargle with warm salt water. The discomfort should disappear within 24 hours.  About my Jackson-Pratt Bulb Drain  What is a Jackson-Pratt bulb? A Jackson-Pratt is a soft, round device used to collect drainage. It is connected to a long, thin drainage catheter, which is held in place by one or two small stiches near your surgical incision site. When the bulb is squeezed, it forms a vacuum, forcing the drainage to empty into the bulb.  Emptying the Jackson-Pratt bulb- To empty the bulb: 1. Release the plug on the top of the bulb. 2. Pour the bulb's contents into a measuring container which your nurse will provide. 3. Record the time emptied and amount of drainage. Empty the drain(s) as often as your      doctor or nurse recommends.  Date                  Time                    Amount (Drain 1)                 Amount (Drain 2)  _____________________________________________________________________  _____________________________________________________________________  _____________________________________________________________________  _____________________________________________________________________  _____________________________________________________________________  _____________________________________________________________________  _____________________________________________________________________  _____________________________________________________________________  Squeezing the Jackson-Pratt Bulb- To squeeze the bulb: 1. Make sure the plug at the top of the bulb is open. 2. Squeeze the bulb tightly in your fist. You will hear air squeezing from the bulb. 3. Replace the plug while the bulb is squeezed. 4. Use a safety pin to attach the bulb to your clothing. This will keep the catheter from     pulling at the bulb insertion site.  When to call your doctor- Call your doctor if:  Drain site becomes red, swollen or hot.  You have a fever greater than 101 degrees F.  There is oozing at the drain site.  Drain falls out (apply a guaze bandage over the drain hole and secure it with tape).  Drainage increases daily not related to activity patterns. (You will usually have more drainage when you are active than when you are resting.)  Drainage has a bad odor.     

## 2013-08-13 NOTE — Op Note (Signed)
Anne Benitez:  Anne Benitez             ACCOUNT NO.:  [id number]  MEDICAL RECORD NO.:  [id number]  LOCATION:                                 FACILITY:  PHYSICIAN:  Brantley PersonsMary Contogiannis, M.D.DATE OF BIRTH:  06/15/1970  DATE OF PROCEDURE:  08/12/2013 DATE OF DISCHARGE:  08/12/2013                              OPERATIVE REPORT   PREOPERATIVE DIAGNOSIS:  Bilateral macromastia.  POSTOPERATIVE DIAGNOSIS:  Bilateral macromastia.  PROCEDURE:  Bilateral reduction mammoplasties.  ATTENDING SURGEON:  Brantley PersonsMary Contogiannis, M.D.  ANESTHESIA:  General.  ANESTHESIOLOGIST:  Zenon MayoW. Edmond Fitzgerald, MD.  FLUID REPLACEMENT:  3300 mL crystalloid.  URINE OUTPUT:  750 mL.  ESTIMATED BLOOD LOSS:  150 mL.  COMPLICATIONS:  None.  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 She presents to undergo bilateral reduction mammoplasties.  PROCEDURE IN DETAIL:  The patient was marked in preop holding area in the pattern of Wise for the future bilateral reduction mammoplasties. She was then taken to the OR, placed on table in supine position.  After adequate general anesthesia was obtained, the patient's chest was prepped with Techni-Care and draped in sterile fashion.  The bases of the breasts were then injected with 1% lidocaine with epinephrine.  After adequate hemostasis and anesthesia taken effect, the procedure was begun.  Both of the breast reductions were performed in the following similar manner.  The nipple-areolar complex was marked with a 45 mm nipple marker.  The skin was then incised and deepithelialized around the nipple-areolar complex down to the inframammary crease in the inferior pedicle pattern.  Next the medial, superior, and lateral skin flaps were elevated down to the chest wall.  The excess fat and glandular tissue were removed from the inferior pedicle.  The nipple was examined and found to be  pink and viable.  The wound was irrigated with saline irrigation.  Meticulous hemostasis was obtained with the Bovie electrocautery. The inferior pedicle was then centralized using 3-0 Prolene suture.  A #10 JP flat fully fluted drain was placed into the wound. The skin flaps were brought together at the inverted T junction with a 2- 0 Prolene suture.  The incisions were stapled for temporary closure. The breasts were compared and found to have good shape and symmetry. The incisions were then closed from the medial aspect of the JP drain to the medial aspect of the [facility] incision by 1st placing a few 3-0 Monocryl sutures to close the dermal layer, and then both the dermal and cuticular layers were closed in a single layer using a 2-0 Quill PDS barbed suture.  Lateral to the JP drain the incision was closed using 3-0 Monocryl in the dermal layer followed by 3-0 Monocryl running intracuticular stitch on the skin.  The vertical limb of the Wise pattern was closed in the dermal layer using 3-0 Monocryl suture.    The patient was then placed in the upright position.  The future location of the nipple-areolar complexes was marked on both breast mounds using the 42 mm nipple marker.  She was then placed back in the recumbent position.  Both of the nipple-areolar complexes were brought  out onto the breast mounds in the following similar manner.  The skin was incised as marked and removed full thickness into the subcutaneous tissues.  The nipple-areolar complex was examined, found to be pink and viable, then brought out through this aperture and sewn in place using 4-0 Monocryl in the dermal layer, followed by 5-0 Monocryl running intracuticular stitch on skin.  The vertical limb of the Wise pattern was then closed in the intracuticular layer using the 5-0 Monocryl suture in continuity from the nipple-areolar closure.  The drains were sewn in place using a 3-0 nylon suture.  Prior to closing the wounds,  and after the incisions were sutured 1.3% Exparel (total 266 mg) was injected into the pectoralis major muscle, the chest wall, soft tissues, and the incisions to provide postsurgical pain control.  The incisions were dressed with benzoin and Steri-Strips, and the nipples additionally with bacitracin ointment and Adaptic. 4x4s were placed over the incisions and bacitracin ointment and Adaptic additionally over the nipples.  ABD pads were placed in the axillary areas.  The patient was then placed into a  light postoperative support bra.  There are no complications.  The patient tolerated the procedure well.  The final needle and sponge counts were reported correct at the end of the case.  The patient was then awakened from the general anesthesia and taken to recovery room in stable condition.  She was recovered without complications. Both the patient and her husband were given proper postoperative wound care instructions including care of the JP drains.  She was then discharged in the care of her husband in stable condition.  Follow up appointment will be Friday in the office.    ______________________________ Brantley PersonsMary Contogiannis, M.D.   ______________________________ Brantley PersonsMary Contogiannis, M.D.    MC/MEDQ  D:  08/12/2013  T:  08/13/2013  Job:  [id number]

## 2013-08-18 ENCOUNTER — Encounter (HOSPITAL_BASED_OUTPATIENT_CLINIC_OR_DEPARTMENT_OTHER): Payer: Self-pay | Admitting: Plastic Surgery

## 2016-02-24 DIAGNOSIS — M9903 Segmental and somatic dysfunction of lumbar region: Secondary | ICD-10-CM | POA: Diagnosis not present

## 2016-02-24 DIAGNOSIS — M5431 Sciatica, right side: Secondary | ICD-10-CM | POA: Diagnosis not present

## 2016-02-24 DIAGNOSIS — M9904 Segmental and somatic dysfunction of sacral region: Secondary | ICD-10-CM | POA: Diagnosis not present

## 2016-02-24 DIAGNOSIS — M9902 Segmental and somatic dysfunction of thoracic region: Secondary | ICD-10-CM | POA: Diagnosis not present

## 2016-07-03 DIAGNOSIS — M542 Cervicalgia: Secondary | ICD-10-CM | POA: Diagnosis not present

## 2016-07-03 DIAGNOSIS — M9901 Segmental and somatic dysfunction of cervical region: Secondary | ICD-10-CM | POA: Diagnosis not present

## 2016-07-03 DIAGNOSIS — M9902 Segmental and somatic dysfunction of thoracic region: Secondary | ICD-10-CM | POA: Diagnosis not present

## 2016-07-03 DIAGNOSIS — M546 Pain in thoracic spine: Secondary | ICD-10-CM | POA: Diagnosis not present

## 2016-08-10 DIAGNOSIS — M9901 Segmental and somatic dysfunction of cervical region: Secondary | ICD-10-CM | POA: Diagnosis not present

## 2016-08-10 DIAGNOSIS — M542 Cervicalgia: Secondary | ICD-10-CM | POA: Diagnosis not present

## 2016-08-10 DIAGNOSIS — M546 Pain in thoracic spine: Secondary | ICD-10-CM | POA: Diagnosis not present

## 2016-08-10 DIAGNOSIS — M9902 Segmental and somatic dysfunction of thoracic region: Secondary | ICD-10-CM | POA: Diagnosis not present

## 2016-11-22 DIAGNOSIS — M9901 Segmental and somatic dysfunction of cervical region: Secondary | ICD-10-CM | POA: Diagnosis not present

## 2016-11-22 DIAGNOSIS — M9902 Segmental and somatic dysfunction of thoracic region: Secondary | ICD-10-CM | POA: Diagnosis not present

## 2016-11-22 DIAGNOSIS — M546 Pain in thoracic spine: Secondary | ICD-10-CM | POA: Diagnosis not present

## 2016-11-22 DIAGNOSIS — M542 Cervicalgia: Secondary | ICD-10-CM | POA: Diagnosis not present

## 2017-02-12 DIAGNOSIS — M546 Pain in thoracic spine: Secondary | ICD-10-CM | POA: Diagnosis not present

## 2017-02-12 DIAGNOSIS — M9901 Segmental and somatic dysfunction of cervical region: Secondary | ICD-10-CM | POA: Diagnosis not present

## 2017-02-12 DIAGNOSIS — M542 Cervicalgia: Secondary | ICD-10-CM | POA: Diagnosis not present

## 2017-02-12 DIAGNOSIS — M9902 Segmental and somatic dysfunction of thoracic region: Secondary | ICD-10-CM | POA: Diagnosis not present

## 2017-03-16 DIAGNOSIS — M9901 Segmental and somatic dysfunction of cervical region: Secondary | ICD-10-CM | POA: Diagnosis not present

## 2017-03-16 DIAGNOSIS — M542 Cervicalgia: Secondary | ICD-10-CM | POA: Diagnosis not present

## 2017-03-16 DIAGNOSIS — M546 Pain in thoracic spine: Secondary | ICD-10-CM | POA: Diagnosis not present

## 2017-03-16 DIAGNOSIS — M9902 Segmental and somatic dysfunction of thoracic region: Secondary | ICD-10-CM | POA: Diagnosis not present

## 2017-04-04 DIAGNOSIS — M546 Pain in thoracic spine: Secondary | ICD-10-CM | POA: Diagnosis not present

## 2017-04-04 DIAGNOSIS — M9902 Segmental and somatic dysfunction of thoracic region: Secondary | ICD-10-CM | POA: Diagnosis not present

## 2017-04-04 DIAGNOSIS — M9901 Segmental and somatic dysfunction of cervical region: Secondary | ICD-10-CM | POA: Diagnosis not present

## 2017-04-04 DIAGNOSIS — M542 Cervicalgia: Secondary | ICD-10-CM | POA: Diagnosis not present

## 2017-04-13 DIAGNOSIS — M542 Cervicalgia: Secondary | ICD-10-CM | POA: Diagnosis not present

## 2017-04-13 DIAGNOSIS — M546 Pain in thoracic spine: Secondary | ICD-10-CM | POA: Diagnosis not present

## 2017-04-13 DIAGNOSIS — M9902 Segmental and somatic dysfunction of thoracic region: Secondary | ICD-10-CM | POA: Diagnosis not present

## 2017-04-13 DIAGNOSIS — M9901 Segmental and somatic dysfunction of cervical region: Secondary | ICD-10-CM | POA: Diagnosis not present

## 2017-04-16 DIAGNOSIS — M542 Cervicalgia: Secondary | ICD-10-CM | POA: Diagnosis not present

## 2017-04-16 DIAGNOSIS — M546 Pain in thoracic spine: Secondary | ICD-10-CM | POA: Diagnosis not present

## 2017-04-16 DIAGNOSIS — M9902 Segmental and somatic dysfunction of thoracic region: Secondary | ICD-10-CM | POA: Diagnosis not present

## 2017-04-16 DIAGNOSIS — M9901 Segmental and somatic dysfunction of cervical region: Secondary | ICD-10-CM | POA: Diagnosis not present

## 2017-06-28 DIAGNOSIS — M542 Cervicalgia: Secondary | ICD-10-CM | POA: Diagnosis not present

## 2017-06-28 DIAGNOSIS — M9902 Segmental and somatic dysfunction of thoracic region: Secondary | ICD-10-CM | POA: Diagnosis not present

## 2017-06-28 DIAGNOSIS — G44209 Tension-type headache, unspecified, not intractable: Secondary | ICD-10-CM | POA: Diagnosis not present

## 2017-06-28 DIAGNOSIS — M9901 Segmental and somatic dysfunction of cervical region: Secondary | ICD-10-CM | POA: Diagnosis not present

## 2017-07-04 DIAGNOSIS — M9902 Segmental and somatic dysfunction of thoracic region: Secondary | ICD-10-CM | POA: Diagnosis not present

## 2017-07-04 DIAGNOSIS — M542 Cervicalgia: Secondary | ICD-10-CM | POA: Diagnosis not present

## 2017-07-04 DIAGNOSIS — G44209 Tension-type headache, unspecified, not intractable: Secondary | ICD-10-CM | POA: Diagnosis not present

## 2017-07-04 DIAGNOSIS — M9901 Segmental and somatic dysfunction of cervical region: Secondary | ICD-10-CM | POA: Diagnosis not present

## 2017-07-25 DIAGNOSIS — M545 Low back pain: Secondary | ICD-10-CM | POA: Diagnosis not present

## 2017-07-25 DIAGNOSIS — M542 Cervicalgia: Secondary | ICD-10-CM | POA: Diagnosis not present

## 2017-07-25 DIAGNOSIS — M9901 Segmental and somatic dysfunction of cervical region: Secondary | ICD-10-CM | POA: Diagnosis not present

## 2017-07-25 DIAGNOSIS — M9903 Segmental and somatic dysfunction of lumbar region: Secondary | ICD-10-CM | POA: Diagnosis not present

## 2017-08-01 DIAGNOSIS — M545 Low back pain: Secondary | ICD-10-CM | POA: Diagnosis not present

## 2017-08-01 DIAGNOSIS — M9903 Segmental and somatic dysfunction of lumbar region: Secondary | ICD-10-CM | POA: Diagnosis not present

## 2017-08-01 DIAGNOSIS — M542 Cervicalgia: Secondary | ICD-10-CM | POA: Diagnosis not present

## 2017-08-01 DIAGNOSIS — M9901 Segmental and somatic dysfunction of cervical region: Secondary | ICD-10-CM | POA: Diagnosis not present

## 2017-08-08 DIAGNOSIS — M9901 Segmental and somatic dysfunction of cervical region: Secondary | ICD-10-CM | POA: Diagnosis not present

## 2017-08-08 DIAGNOSIS — M9903 Segmental and somatic dysfunction of lumbar region: Secondary | ICD-10-CM | POA: Diagnosis not present

## 2017-08-08 DIAGNOSIS — M542 Cervicalgia: Secondary | ICD-10-CM | POA: Diagnosis not present

## 2017-08-08 DIAGNOSIS — M545 Low back pain: Secondary | ICD-10-CM | POA: Diagnosis not present

## 2017-08-15 DIAGNOSIS — M545 Low back pain: Secondary | ICD-10-CM | POA: Diagnosis not present

## 2017-08-15 DIAGNOSIS — M9903 Segmental and somatic dysfunction of lumbar region: Secondary | ICD-10-CM | POA: Diagnosis not present

## 2017-08-15 DIAGNOSIS — M542 Cervicalgia: Secondary | ICD-10-CM | POA: Diagnosis not present

## 2017-08-15 DIAGNOSIS — M9901 Segmental and somatic dysfunction of cervical region: Secondary | ICD-10-CM | POA: Diagnosis not present

## 2017-08-29 DIAGNOSIS — M9901 Segmental and somatic dysfunction of cervical region: Secondary | ICD-10-CM | POA: Diagnosis not present

## 2017-08-29 DIAGNOSIS — M9903 Segmental and somatic dysfunction of lumbar region: Secondary | ICD-10-CM | POA: Diagnosis not present

## 2017-08-29 DIAGNOSIS — M545 Low back pain: Secondary | ICD-10-CM | POA: Diagnosis not present

## 2017-08-29 DIAGNOSIS — M542 Cervicalgia: Secondary | ICD-10-CM | POA: Diagnosis not present

## 2017-09-19 DIAGNOSIS — M9903 Segmental and somatic dysfunction of lumbar region: Secondary | ICD-10-CM | POA: Diagnosis not present

## 2017-09-19 DIAGNOSIS — M542 Cervicalgia: Secondary | ICD-10-CM | POA: Diagnosis not present

## 2017-09-19 DIAGNOSIS — M9901 Segmental and somatic dysfunction of cervical region: Secondary | ICD-10-CM | POA: Diagnosis not present

## 2017-09-19 DIAGNOSIS — M545 Low back pain: Secondary | ICD-10-CM | POA: Diagnosis not present

## 2017-11-07 DIAGNOSIS — M9903 Segmental and somatic dysfunction of lumbar region: Secondary | ICD-10-CM | POA: Diagnosis not present

## 2017-11-07 DIAGNOSIS — M542 Cervicalgia: Secondary | ICD-10-CM | POA: Diagnosis not present

## 2017-11-07 DIAGNOSIS — M545 Low back pain: Secondary | ICD-10-CM | POA: Diagnosis not present

## 2017-11-07 DIAGNOSIS — M9901 Segmental and somatic dysfunction of cervical region: Secondary | ICD-10-CM | POA: Diagnosis not present

## 2017-11-15 DIAGNOSIS — M545 Low back pain: Secondary | ICD-10-CM | POA: Diagnosis not present

## 2017-11-15 DIAGNOSIS — M9901 Segmental and somatic dysfunction of cervical region: Secondary | ICD-10-CM | POA: Diagnosis not present

## 2017-11-15 DIAGNOSIS — M9903 Segmental and somatic dysfunction of lumbar region: Secondary | ICD-10-CM | POA: Diagnosis not present

## 2017-11-15 DIAGNOSIS — M542 Cervicalgia: Secondary | ICD-10-CM | POA: Diagnosis not present

## 2017-11-28 DIAGNOSIS — M545 Low back pain: Secondary | ICD-10-CM | POA: Diagnosis not present

## 2017-11-28 DIAGNOSIS — M542 Cervicalgia: Secondary | ICD-10-CM | POA: Diagnosis not present

## 2017-11-28 DIAGNOSIS — M9901 Segmental and somatic dysfunction of cervical region: Secondary | ICD-10-CM | POA: Diagnosis not present

## 2017-11-28 DIAGNOSIS — M9903 Segmental and somatic dysfunction of lumbar region: Secondary | ICD-10-CM | POA: Diagnosis not present

## 2017-12-13 DIAGNOSIS — M9903 Segmental and somatic dysfunction of lumbar region: Secondary | ICD-10-CM | POA: Diagnosis not present

## 2017-12-13 DIAGNOSIS — M9901 Segmental and somatic dysfunction of cervical region: Secondary | ICD-10-CM | POA: Diagnosis not present

## 2017-12-13 DIAGNOSIS — M545 Low back pain: Secondary | ICD-10-CM | POA: Diagnosis not present

## 2017-12-13 DIAGNOSIS — M542 Cervicalgia: Secondary | ICD-10-CM | POA: Diagnosis not present

## 2017-12-17 DIAGNOSIS — Z0000 Encounter for general adult medical examination without abnormal findings: Secondary | ICD-10-CM | POA: Diagnosis not present

## 2018-01-28 DIAGNOSIS — M9901 Segmental and somatic dysfunction of cervical region: Secondary | ICD-10-CM | POA: Diagnosis not present

## 2018-01-28 DIAGNOSIS — M9903 Segmental and somatic dysfunction of lumbar region: Secondary | ICD-10-CM | POA: Diagnosis not present

## 2018-01-28 DIAGNOSIS — M542 Cervicalgia: Secondary | ICD-10-CM | POA: Diagnosis not present

## 2018-01-28 DIAGNOSIS — M545 Low back pain: Secondary | ICD-10-CM | POA: Diagnosis not present

## 2018-05-29 DIAGNOSIS — M542 Cervicalgia: Secondary | ICD-10-CM | POA: Diagnosis not present

## 2018-05-29 DIAGNOSIS — M545 Low back pain: Secondary | ICD-10-CM | POA: Diagnosis not present

## 2018-05-29 DIAGNOSIS — M9901 Segmental and somatic dysfunction of cervical region: Secondary | ICD-10-CM | POA: Diagnosis not present

## 2018-05-29 DIAGNOSIS — M9903 Segmental and somatic dysfunction of lumbar region: Secondary | ICD-10-CM | POA: Diagnosis not present

## 2018-10-10 DIAGNOSIS — M9901 Segmental and somatic dysfunction of cervical region: Secondary | ICD-10-CM | POA: Diagnosis not present

## 2018-10-10 DIAGNOSIS — M542 Cervicalgia: Secondary | ICD-10-CM | POA: Diagnosis not present

## 2018-10-10 DIAGNOSIS — M545 Low back pain: Secondary | ICD-10-CM | POA: Diagnosis not present

## 2018-10-10 DIAGNOSIS — M9903 Segmental and somatic dysfunction of lumbar region: Secondary | ICD-10-CM | POA: Diagnosis not present

## 2018-11-07 DIAGNOSIS — M542 Cervicalgia: Secondary | ICD-10-CM | POA: Diagnosis not present

## 2018-11-07 DIAGNOSIS — M545 Low back pain: Secondary | ICD-10-CM | POA: Diagnosis not present

## 2018-11-07 DIAGNOSIS — M9903 Segmental and somatic dysfunction of lumbar region: Secondary | ICD-10-CM | POA: Diagnosis not present

## 2018-11-07 DIAGNOSIS — M9901 Segmental and somatic dysfunction of cervical region: Secondary | ICD-10-CM | POA: Diagnosis not present

## 2019-02-04 DIAGNOSIS — M9903 Segmental and somatic dysfunction of lumbar region: Secondary | ICD-10-CM | POA: Diagnosis not present

## 2019-02-04 DIAGNOSIS — M5441 Lumbago with sciatica, right side: Secondary | ICD-10-CM | POA: Diagnosis not present

## 2019-02-04 DIAGNOSIS — M9902 Segmental and somatic dysfunction of thoracic region: Secondary | ICD-10-CM | POA: Diagnosis not present

## 2019-02-04 DIAGNOSIS — M546 Pain in thoracic spine: Secondary | ICD-10-CM | POA: Diagnosis not present

## 2019-02-25 DIAGNOSIS — M9903 Segmental and somatic dysfunction of lumbar region: Secondary | ICD-10-CM | POA: Diagnosis not present

## 2019-02-25 DIAGNOSIS — M546 Pain in thoracic spine: Secondary | ICD-10-CM | POA: Diagnosis not present

## 2019-02-25 DIAGNOSIS — M5441 Lumbago with sciatica, right side: Secondary | ICD-10-CM | POA: Diagnosis not present

## 2019-02-25 DIAGNOSIS — M9902 Segmental and somatic dysfunction of thoracic region: Secondary | ICD-10-CM | POA: Diagnosis not present

## 2019-06-24 DIAGNOSIS — Z3041 Encounter for surveillance of contraceptive pills: Secondary | ICD-10-CM | POA: Diagnosis not present

## 2019-12-31 DIAGNOSIS — Z0000 Encounter for general adult medical examination without abnormal findings: Secondary | ICD-10-CM | POA: Diagnosis not present

## 2019-12-31 DIAGNOSIS — R030 Elevated blood-pressure reading, without diagnosis of hypertension: Secondary | ICD-10-CM | POA: Diagnosis not present

## 2020-03-23 DIAGNOSIS — S82402A Unspecified fracture of shaft of left fibula, initial encounter for closed fracture: Secondary | ICD-10-CM | POA: Diagnosis not present

## 2020-03-23 DIAGNOSIS — S82892A Other fracture of left lower leg, initial encounter for closed fracture: Secondary | ICD-10-CM | POA: Diagnosis not present

## 2020-03-23 DIAGNOSIS — S82202A Unspecified fracture of shaft of left tibia, initial encounter for closed fracture: Secondary | ICD-10-CM | POA: Diagnosis not present

## 2020-04-05 DIAGNOSIS — S82872A Displaced pilon fracture of left tibia, initial encounter for closed fracture: Secondary | ICD-10-CM | POA: Diagnosis not present

## 2020-04-05 DIAGNOSIS — S82872D Displaced pilon fracture of left tibia, subsequent encounter for closed fracture with routine healing: Secondary | ICD-10-CM | POA: Diagnosis not present

## 2020-04-13 DIAGNOSIS — S82872D Displaced pilon fracture of left tibia, subsequent encounter for closed fracture with routine healing: Secondary | ICD-10-CM | POA: Diagnosis not present
